# Patient Record
Sex: Female | Born: 1965 | Race: White | Hispanic: No | Marital: Married | Smoking: Current every day smoker
Health system: Southern US, Community
[De-identification: ages and names within clinical notes are randomized; demographics above are authoritative.]

## PROBLEM LIST (undated history)

## (undated) DIAGNOSIS — O00109 Unspecified tubal pregnancy without intrauterine pregnancy: Secondary | ICD-10-CM

## (undated) DIAGNOSIS — C801 Malignant (primary) neoplasm, unspecified: Secondary | ICD-10-CM

## (undated) HISTORY — PX: OOPHORECTOMY: SHX86

---

## 2005-02-10 ENCOUNTER — Emergency Department: Payer: Self-pay | Admitting: Emergency Medicine

## 2007-03-21 ENCOUNTER — Emergency Department: Payer: Self-pay | Admitting: Emergency Medicine

## 2008-05-19 ENCOUNTER — Emergency Department: Payer: Self-pay | Admitting: Internal Medicine

## 2009-03-28 ENCOUNTER — Emergency Department: Payer: Self-pay | Admitting: Unknown Physician Specialty

## 2010-04-30 ENCOUNTER — Emergency Department: Payer: Self-pay | Admitting: Emergency Medicine

## 2010-06-25 ENCOUNTER — Emergency Department: Payer: Self-pay | Admitting: Emergency Medicine

## 2010-09-09 ENCOUNTER — Emergency Department: Payer: Self-pay | Admitting: Emergency Medicine

## 2012-03-05 ENCOUNTER — Emergency Department: Payer: Self-pay | Admitting: Internal Medicine

## 2012-05-16 ENCOUNTER — Emergency Department: Payer: Self-pay | Admitting: Internal Medicine

## 2014-12-14 ENCOUNTER — Emergency Department: Admit: 2014-12-14 | Discharge status: Against Medical Advice | Payer: Self-pay | Admitting: Emergency Medicine

## 2014-12-15 ENCOUNTER — Emergency Department: Admit: 2014-12-15 | Disposition: A | Discharge status: Home / Self Care | Payer: Self-pay | Admitting: Internal Medicine

## 2016-06-17 ENCOUNTER — Encounter: Payer: Self-pay | Admitting: Intensive Care

## 2016-06-17 ENCOUNTER — Emergency Department: Payer: Self-pay

## 2016-06-17 ENCOUNTER — Emergency Department
Admission: EM | Admit: 2016-06-17 | Discharge: 2016-06-17 | Disposition: A | Discharge status: Home / Self Care | Payer: Self-pay | Attending: Emergency Medicine | Admitting: Emergency Medicine

## 2016-06-17 DIAGNOSIS — Y929 Unspecified place or not applicable: Secondary | ICD-10-CM | POA: Insufficient documentation

## 2016-06-17 DIAGNOSIS — F17210 Nicotine dependence, cigarettes, uncomplicated: Secondary | ICD-10-CM | POA: Insufficient documentation

## 2016-06-17 DIAGNOSIS — X500XXA Overexertion from strenuous movement or load, initial encounter: Secondary | ICD-10-CM | POA: Insufficient documentation

## 2016-06-17 DIAGNOSIS — M7918 Myalgia, other site: Secondary | ICD-10-CM

## 2016-06-17 DIAGNOSIS — R0781 Pleurodynia: Secondary | ICD-10-CM | POA: Insufficient documentation

## 2016-06-17 DIAGNOSIS — Y990 Civilian activity done for income or pay: Secondary | ICD-10-CM | POA: Insufficient documentation

## 2016-06-17 DIAGNOSIS — Z8543 Personal history of malignant neoplasm of ovary: Secondary | ICD-10-CM | POA: Insufficient documentation

## 2016-06-17 DIAGNOSIS — Y9389 Activity, other specified: Secondary | ICD-10-CM | POA: Insufficient documentation

## 2016-06-17 HISTORY — DX: Malignant (primary) neoplasm, unspecified: C80.1

## 2016-06-17 HISTORY — DX: Unspecified tubal pregnancy without intrauterine pregnancy: O00.109

## 2016-06-17 MED ORDER — NAPROXEN 500 MG PO TABS
500.0000 mg | ORAL_TABLET | Freq: Once | ORAL | Status: AC
  Administered 2016-06-17: 500 mg via ORAL
  Filled 2016-06-17: qty 1

## 2016-06-17 MED ORDER — CYCLOBENZAPRINE HCL 10 MG PO TABS
10.0000 mg | ORAL_TABLET | Freq: Once | ORAL | Status: AC
  Administered 2016-06-17: 10 mg via ORAL
  Filled 2016-06-17: qty 1

## 2016-06-17 MED ORDER — NAPROXEN 500 MG PO TABS: 500.0000 mg | ORAL_TABLET | Freq: Two times a day (BID) | ORAL | 0 refills | Status: DC

## 2016-06-17 MED ORDER — CYCLOBENZAPRINE HCL 10 MG PO TABS: 10.0000 mg | ORAL_TABLET | Freq: Three times a day (TID) | ORAL | 0 refills | Status: DC | PRN

## 2016-06-17 NOTE — Discharge Instructions (Signed)
Follow up with the primary care provider of your choice for symptoms not improving over the week. Return to the ER for symptoms that change or worsen.

## 2016-06-17 NOTE — ED Provider Notes (Signed)
[facility] Emergency Department Provider Note ____________________________________________  Time seen: Approximately 7:34 PM  I have reviewed the triage vital signs and the nursing notes.   HISTORY  Chief Complaint Back Pain    HPI Robin Cline is a 50 y.o. female who presents to the emergency department for evaluation of right upper rib/back pain. She does not recall a specific injury. Pain started Saturday evening and has worsened with each day. She works as a Scientist, water quality and states that the constant twisting motion may be the cause. She has taken tylenol and used [facility] without relief. She does smoke cigarettes and states that the pain increases with deep breath and cough.  Past Medical History:  Diagnosis Date  . 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 Tubal pregnancy     There are no active problems to display for this patient.   Past Surgical History:  Procedure Laterality Date  . OOPHORECTOMY      Prior to Admission medications   Medication Sig Start Date End Date Taking? Authorizing Provider  cyclobenzaprine (FLEXERIL) 10 MG tablet Take 1 tablet (10 mg total) by mouth 3 (three) times daily as needed for muscle spasms. 06/17/16   Victorino Dike, FNP  naproxen (NAPROSYN) 500 MG tablet Take 1 tablet (500 mg total) by mouth 2 (two) times daily with a meal. 06/17/16   Victorino Dike, FNP    Allergies Review of patient's allergies indicates no known allergies.  History reviewed. No pertinent family history.  Social History Social History  Substance Use Topics  . Smoking status: Current Every Day Smoker    Packs/day: 0.50    Types: Cigarettes  . Smokeless tobacco: Never Used  . Alcohol use No    Review of Systems Constitutional: No recent illness. Cardiovascular: Denies chest pain or palpitations. Respiratory: Denies shortness of breath. Musculoskeletal: Pain in right lateral thorax. Skin: Negative for rash, wound, lesion. Neurological:  Negative for focal weakness or numbness.  ____________________________________________   PHYSICAL EXAM:  VITAL SIGNS: ED Triage Vitals  Enc Vitals Group     BP 06/17/16 1836 127/82     Pulse Rate 06/17/16 1836 69     Resp 06/17/16 1836 14     Temp 06/17/16 1836 98.1 F (36.7 C)     Temp Source 06/17/16 1836 Oral     SpO2 06/17/16 1836 99 %     Weight 06/17/16 1836 105 lb (47.6 kg)     Height 06/17/16 1836 5\' 3"  (1.6 m)     Head Circumference --      Peak Flow --      Pain Score 06/17/16 1837 9     Pain Loc --      Pain Edu? --      Excl. in [place]? --     Constitutional: Alert and oriented. Well appearing and in no acute distress. Eyes: Conjunctivae are normal. EOMI. Head: Atraumatic. Neck: No stridor.  Respiratory: Normal respiratory effort. Diminished without definite adventitious sounds. Musculoskeletal: Tenderness to right lateral thorax with radiation around right breast and also in to the right scapular area. Neurologic:  Normal speech and language. No gross focal neurologic deficits are appreciated. Speech is normal. No gait instability. Skin:  Skin is warm, dry and intact. Atraumatic. Psychiatric: Mood and affect are normal. Speech and behavior are normal.  ____________________________________________   LABS (all labs ordered are listed, but only abnormal results are displayed)  Labs Reviewed - No data to display ____________________________________________  RADIOLOGY  No cardiopulmonary abnormality per radiology. I, Sherrie George, personally viewed and evaluated these images (plain radiographs) as part of my medical decision making, as well as reviewing the written report by the radiologist.  ____________________________________________   PROCEDURES  Procedure(s) performed: None   ____________________________________________   INITIAL IMPRESSION / ASSESSMENT AND PLAN / ED COURSE  Clinical Course    Pertinent labs & imaging results that were  available during my care of the patient were reviewed by me and considered in my medical decision making (see chart for details).  Symptoms likely related to frequent and repetitive twisting motion while working causing muscle strain.  Patient was given prescriptions for Flexeril and Naprosyn. She is to follow-up with the primary care provider for choice or return to the emergency department for symptoms that change or worsen. She was also given a work excuse for the next 2 days. ____________________________________________   FINAL CLINICAL IMPRESSION(S) / ED DIAGNOSES  Final diagnoses:  Musculoskeletal pain       Victorino Dike, FNP 06/17/16 2135    Carrie Mew, MD 06/17/16 2342

## 2016-06-17 NOTE — ED Notes (Signed)
Pt reports that she may have "pulled a muscle or cracked a rib" - pain radiates across chest and under right arm - pain increases when she coughs or takes a deep breath - pain started Sunday - pt does not recall any injury but does do some lifting a work that could have injured it (pain is worse at work)

## 2016-06-17 NOTE — ED Triage Notes (Addendum)
Patient presents to ER with R upper side pain that continues to back. She reports she works at [employer] and lifts heavy items and could have pulled a muscle. Pt ambulatory in triage and to waiting area with NAD noted. Pt has tried advil liquid gels with some relief and icy hot.

## 2019-02-23 ENCOUNTER — Encounter: Payer: Self-pay | Admitting: Emergency Medicine

## 2019-02-23 ENCOUNTER — Other Ambulatory Visit: Payer: Self-pay

## 2019-02-23 ENCOUNTER — Emergency Department
Admission: EM | Admit: 2019-02-23 | Discharge: 2019-02-23 | Disposition: A | Discharge status: Home / Self Care | Payer: Self-pay | Attending: Emergency Medicine | Admitting: Emergency Medicine

## 2019-02-23 ENCOUNTER — Emergency Department: Payer: Self-pay

## 2019-02-23 DIAGNOSIS — R42 Dizziness and giddiness: Secondary | ICD-10-CM | POA: Insufficient documentation

## 2019-02-23 DIAGNOSIS — F17210 Nicotine dependence, cigarettes, uncomplicated: Secondary | ICD-10-CM | POA: Insufficient documentation

## 2019-02-23 DIAGNOSIS — Z8543 Personal history of malignant neoplasm of ovary: Secondary | ICD-10-CM | POA: Insufficient documentation

## 2019-02-23 LAB — COMPREHENSIVE METABOLIC PANEL
ALT: 14 U/L (ref 0–44)
AST: 18 U/L (ref 15–41)
Albumin: 4.3 g/dL (ref 3.5–5.0)
Alkaline Phosphatase: 56 U/L (ref 38–126)
Anion gap: 8 (ref 5–15)
BUN: 11 mg/dL (ref 6–20)
CO2: 27 mmol/L (ref 22–32)
Calcium: 8.9 mg/dL (ref 8.9–10.3)
Chloride: 105 mmol/L (ref 98–111)
Creatinine, Ser: 0.68 mg/dL (ref 0.44–1.00)
GFR calc Af Amer: >60 mL/min (ref >60)
GFR calc non Af Amer: >60 mL/min (ref >60)
Glucose, Bld: 91 mg/dL (ref 70–99)
Potassium: 3.6 mmol/L (ref 3.5–5.1)
Sodium: 140 mmol/L (ref 135–145)
Total Bilirubin: 0.5 mg/dL (ref 0.3–1.2)
Total Protein: 7 g/dL (ref 6.5–8.1)

## 2019-02-23 LAB — CBC
HCT: 41.9 % (ref 36.0–46.0)
Hemoglobin: 13.9 g/dL (ref 12.0–15.0)
MCH: 32.3 pg (ref 26.0–34.0)
MCHC: 33.2 g/dL (ref 30.0–36.0)
MCV: 97.4 fL (ref 80.0–100.0)
Platelets: 226 10*3/uL (ref 150–400)
RBC: 4.3 MIL/uL (ref 3.87–5.11)
RDW: 13.2 % (ref 11.5–15.5)
WBC: 6.5 10*3/uL (ref 4.0–10.5)
nRBC: 0 % (ref 0.0–0.2)

## 2019-02-23 LAB — URINALYSIS, COMPLETE (UACMP) WITH MICROSCOPIC
Bacteria, UA: NONE SEEN
Bilirubin Urine: NEGATIVE
Glucose, UA: NEGATIVE mg/dL
Hgb urine dipstick: NEGATIVE
Ketones, ur: NEGATIVE mg/dL
Leukocytes,Ua: NEGATIVE
Nitrite: NEGATIVE
Protein, ur: NEGATIVE mg/dL
Specific Gravity, Urine: 1.004 — ABNORMAL LOW (ref 1.005–1.030)
pH: 6 (ref 5.0–8.0)

## 2019-02-23 LAB — GLUCOSE, CAPILLARY: Glucose-Capillary: 96 mg/dL (ref 70–99)

## 2019-02-23 MED ORDER — MECLIZINE HCL 25 MG PO TABS: 25.0000 mg | ORAL_TABLET | Freq: Three times a day (TID) | ORAL | 0 refills | Status: DC | PRN

## 2019-02-23 MED ORDER — MECLIZINE HCL 25 MG PO TABS
25.0000 mg | ORAL_TABLET | Freq: Once | ORAL | Status: AC
  Administered 2019-02-23: 25 mg via ORAL
  Filled 2019-02-23: qty 1

## 2019-02-23 MED ORDER — SODIUM CHLORIDE 0.9 % IV BOLUS
1000.0000 mL | Freq: Once | INTRAVENOUS | Status: AC
  Administered 2019-02-23: 1000 mL via INTRAVENOUS

## 2019-02-23 NOTE — Discharge Instructions (Signed)
Follow-up with critical clinic neurology.  Please call for an appointment.  Take the meclizine as prescribed.  Drink plenty of water.  Return emergency department worsening.

## 2019-02-23 NOTE — ED Provider Notes (Signed)
[facility] Emergency Department Provider Note  ____________________________________________   First MD Initiated Contact with Patient 02/23/19 1138     (approximate)  I have reviewed the triage vital signs and the nursing notes.   HISTORY  Chief Complaint Dizziness    HPI Robin Cline is a 53 y.o. female presents emergency department stating she has had episodes of dizziness for the last 2 weeks.  She states it is intermittent.  Is not attributed to change in position.  She states she can just be standing it suddenly happened.  She thinks she has been drinking enough fluids.  She denies any chest pain or shortness of breath.  She denies any vomiting or diarrhea.    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   Past Surgical History:  Procedure Laterality Date   OOPHORECTOMY      Prior to Admission medications   Medication Sig Start Date End Date Taking? Authorizing Provider  meclizine (ANTIVERT) 25 MG tablet Take 1 tablet (25 mg total) by mouth 3 (three) times daily as needed for dizziness. 02/23/19   Versie Starks, PA-C    Allergies Patient has no known allergies.  No family history on file.  Social History Social History   Tobacco Use   Smoking status: Current Every Day Smoker    Packs/day: 0.50    Types: Cigarettes   Smokeless tobacco: Never Used  Substance Use Topics   Alcohol use: No   Drug use: Yes    Types: Marijuana    Review of Systems  Constitutional: No fever/chills, positive dizziness Eyes: No visual changes. ENT: No sore throat. Respiratory: Denies cough Genitourinary: Negative for dysuria. Musculoskeletal: Negative for back pain. Skin: Negative for rash.    ____________________________________________   PHYSICAL EXAM:  VITAL SIGNS: ED Triage Vitals [02/23/19 1000]  Enc Vitals Group      BP 139/71     Pulse Rate 72     Resp 20     Temp 97.9 F (36.6 C)     Temp Source Oral     SpO2 100 %     Weight 105 lb (47.6 kg)     Height '5\' 3"'  (1.6 m)     Head Circumference      Peak Flow      Pain Score 0     Pain Loc      Pain Edu?      Excl. in [place]?     Constitutional: Alert and oriented. Well appearing and in no acute distress. Eyes: Conjunctivae are normal.  Positive nystagmus bilaterally Head: Atraumatic. Nose: No congestion/rhinnorhea. Mouth/Throat: Mucous membranes are moist.   Neck:  supple no lymphadenopathy noted Cardiovascular: Normal rate, regular rhythm. Heart sounds are normal Respiratory: Normal respiratory effort.  No retractions, lungs c t a  GU: deferred Musculoskeletal: FROM all extremities, warm and well perfused Neurologic:  Normal speech and language.  Cranial nerves II through XII grossly intact Skin:  Skin is warm, dry and intact. No rash noted. Psychiatric: Mood and affect are normal. Speech and behavior are normal.  ____________________________________________   LABS (all labs ordered are listed, but only abnormal results are displayed)  Labs Reviewed  URINALYSIS, COMPLETE (UACMP) WITH MICROSCOPIC - Abnormal; Notable for the following components:      Result Value   Color, Urine STRAW (*)  APPearance CLEAR (*)    Specific Gravity, Urine 1.004 (*)    All other components within normal limits  CBC  COMPREHENSIVE METABOLIC PANEL  GLUCOSE, CAPILLARY  CBG MONITORING, ED   ____________________________________________   ____________________________________________  RADIOLOGY  Ct head is negative  ____________________________________________   PROCEDURES  Procedure(s) performed: saline lock, ns 1L iv, antivert 74m po  Procedures    ____________________________________________   INITIAL IMPRESSION / ASSESSMENT AND PLAN / ED COURSE  Pertinent labs & imaging results that were available during my care of the patient  were reviewed by me and considered in my medical decision making (see chart for details).   Patient is 53year old female presents emergency department with complaints of dizziness.  Intermittent dyspnea for the last couple of weeks.  She states she drinks lots of sodas.  She does not drink much water.  She states the dizziness will last couple minutes and then go away.  No chest pain or shortness of breath associated with dizziness.  Physical exam patient appears well.  Vitals are normal.  Labs are normal and CT of the head is normal.  EKG is normal  CBC normal, metabolic panel is normal, urinalysis is normal, troponin is normal  CT the head is normal, EKG is normal  Explained all the findings to the patient.  Explained her she needs to drink plenty of water.  Take the Antivert as prescribed.  Follow-up with neurology if not improving.  She states she understands will comply.  Is discharged stable condition.  Return to the emergency department worsening     As part of my medical decision making, I reviewed the following data within the e[place]notes reviewed and incorporated, Labs reviewed CBC, met C, troponin and UA are normal, EKG interpreted NSR, Old chart reviewed, Radiograph reviewed CT of the head is normal, Notes from prior ED visits and [place] Controlled Substance Database  ____________________________________________   FINAL CLINICAL IMPRESSION(S) / ED DIAGNOSES  Final diagnoses:  Dizziness      NEW MEDICATIONS STARTED DURING THIS VISIT:  New Prescriptions   MECLIZINE (ANTIVERT) 25 MG TABLET    Take 1 tablet (25 mg total) by mouth 3 (three) times daily as needed for dizziness.     Note:  This document was prepared using Dragon voice recognition software and may include unintentional dictation errors.    FVersie Starks PA-C 02/23/19 1355    SCarrie Mew MD 02/23/19 1436

## 2019-02-23 NOTE — ED Triage Notes (Signed)
Pt reports for the last couple of weeks she has had intermittent episodes of dizziness that will last a couple of minutes and then go away. Pt denies pain or any other sx's.

## 2019-10-18 IMAGING — CT CT HEAD WITHOUT CONTRAST
3 series · 16 of 46 positions shown, 19 images · non-contrast
Comparison: Head CT 03/05/2012.

CLINICAL DATA: Intermittent episodes of dizziness the last 2-3
minutes and then resolve over the past several weeks.

EXAM:
CT HEAD WITHOUT CONTRAST
TECHNIQUE: Contiguous axial images were obtained from the base of the skull
through the vertex without intravenous contrast.

[Series 2: head wo · axial · 0.39mm/px · z∈[-91,+29]mm · 10 of 29 slices shown, 13 images]
[im 3/29  brain]
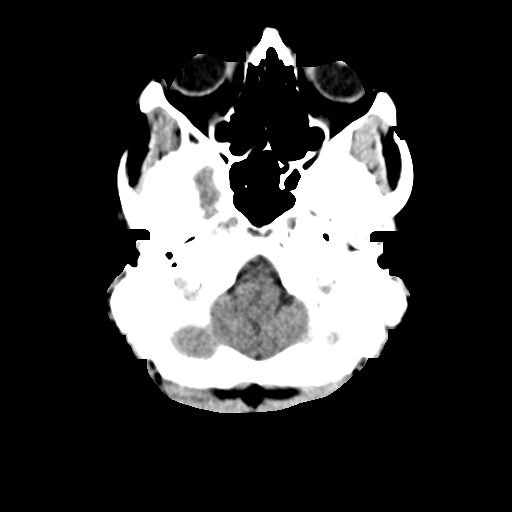
[im 3/29  bone]
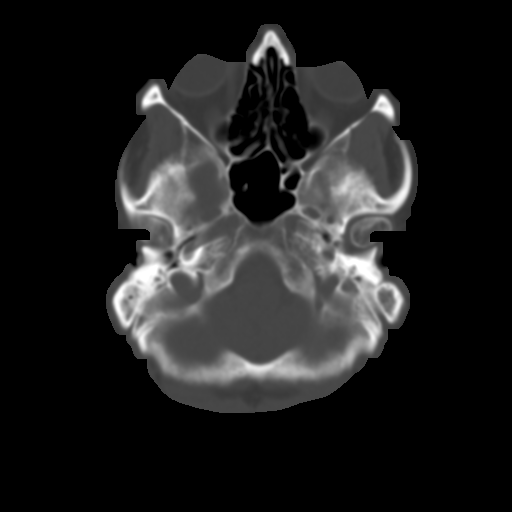
[im 6/29  brain]
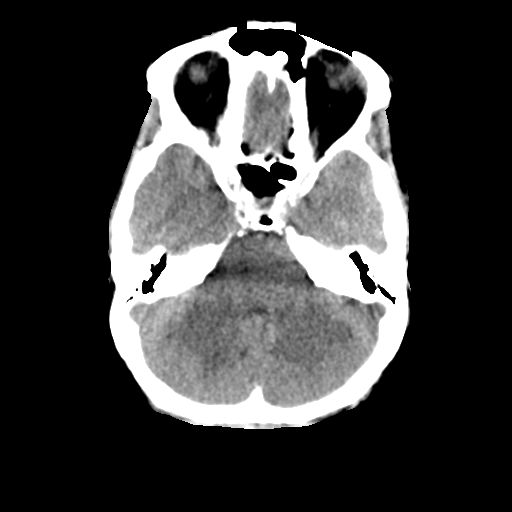
[im 8/29  brain]
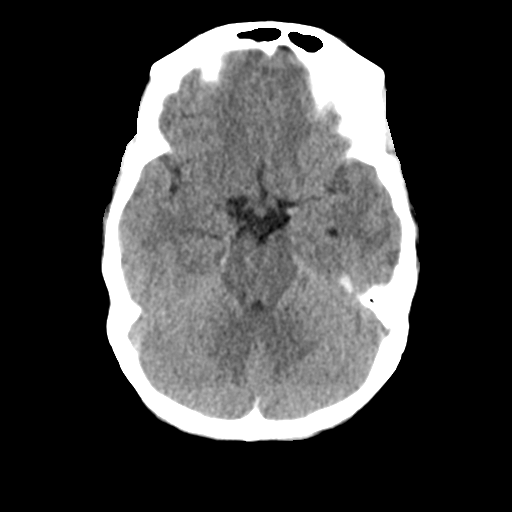
[im 11/29  brain]
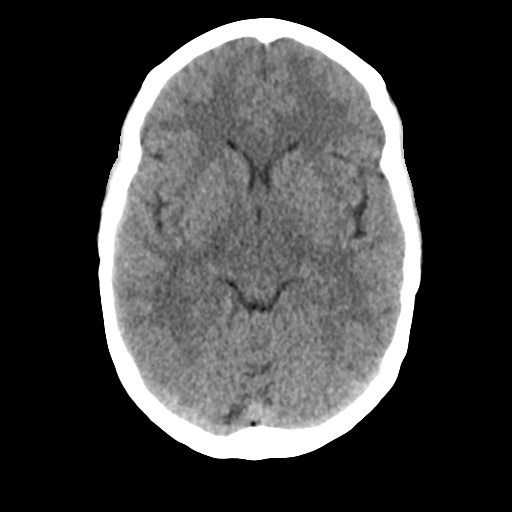
[im 14/29  brain]
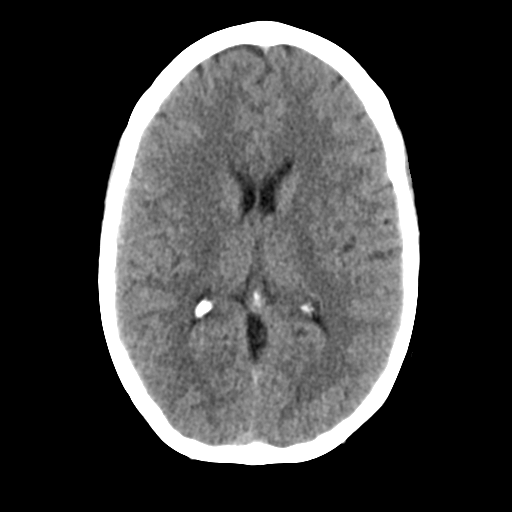
[im 14/29  bone]
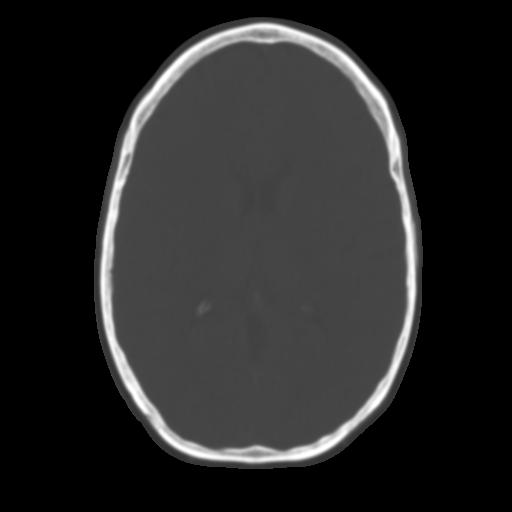
[im 16/29  brain]
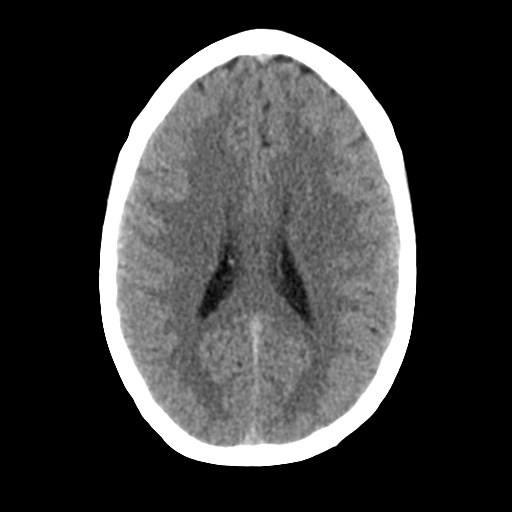
[im 19/29  brain]
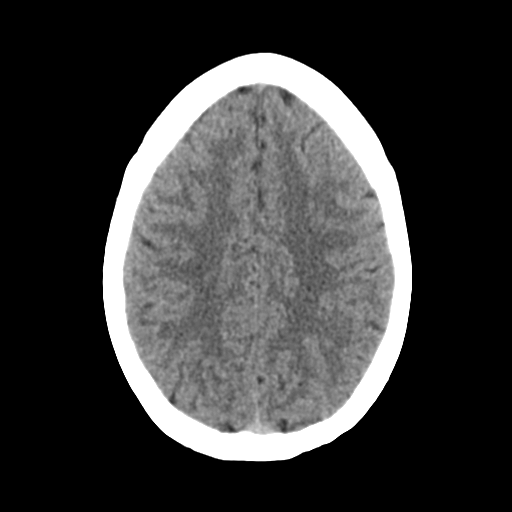
[im 22/29  brain]
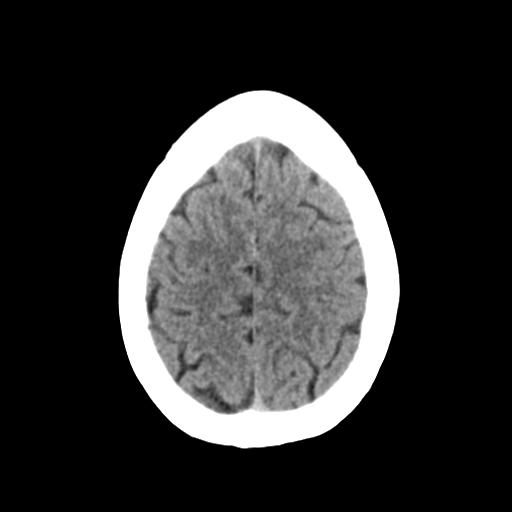
[im 24/29  brain]
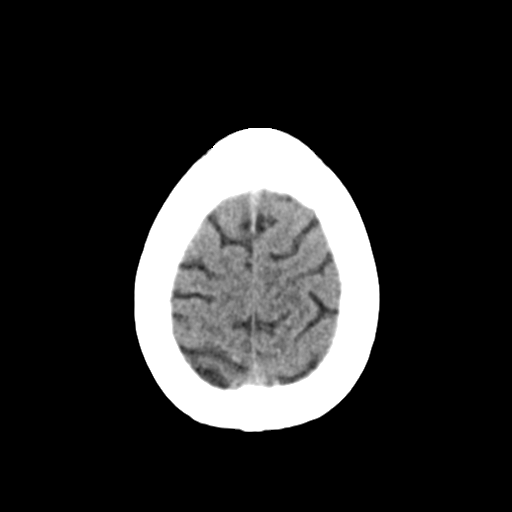
[im 24/29  bone]
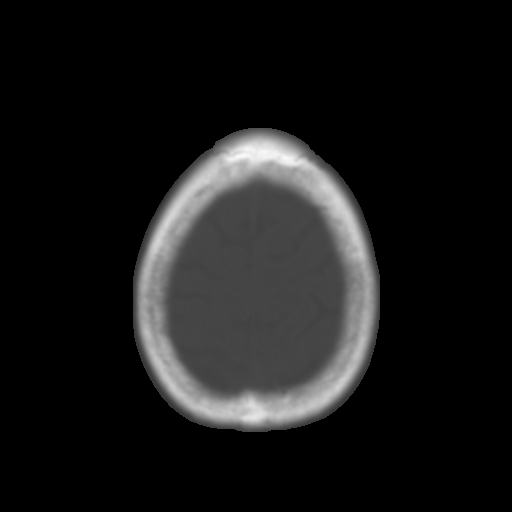
[im 27/29  brain]
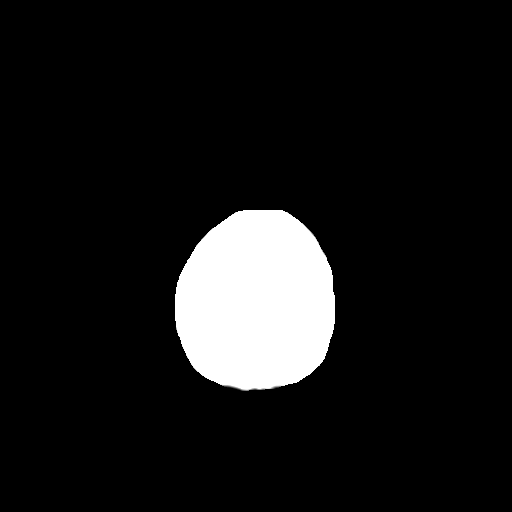

[Series 4: coronal soft tissue · coronal · 0.27mm/px · 3 of 60 slices shown]
[im 20/60  brain]
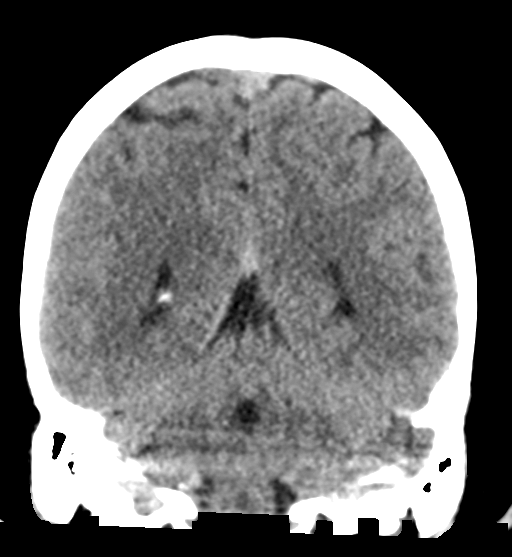
[im 27/60  brain]
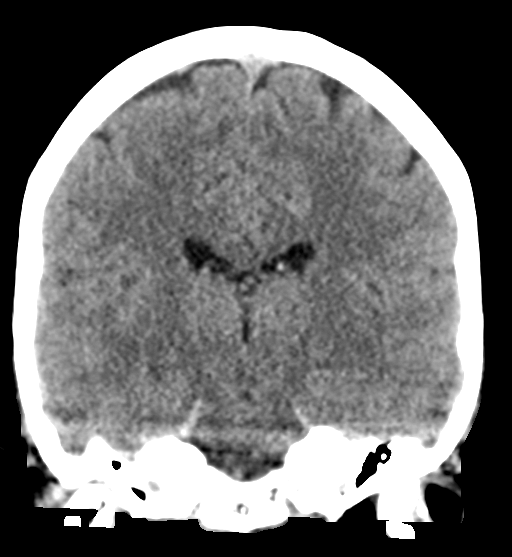
[im 33/60  brain]
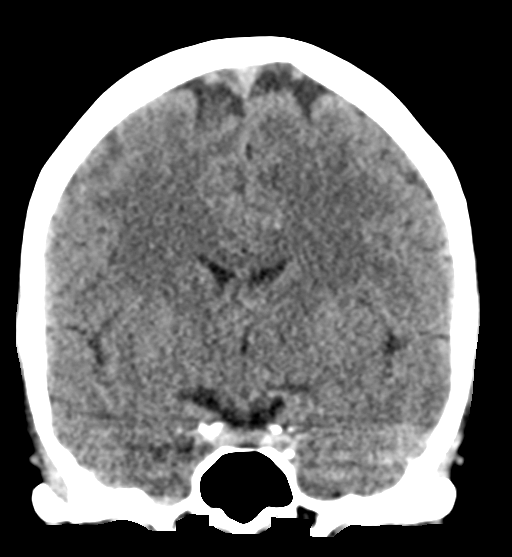

[Series 5: sagittal soft tissue · sagittal · 0.29mm/px · 3 of 52 slices shown]
[im 18/52  brain]
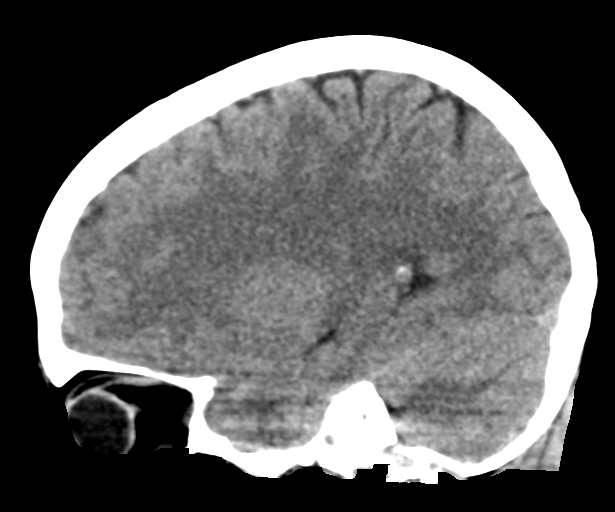
[im 26/52  brain]
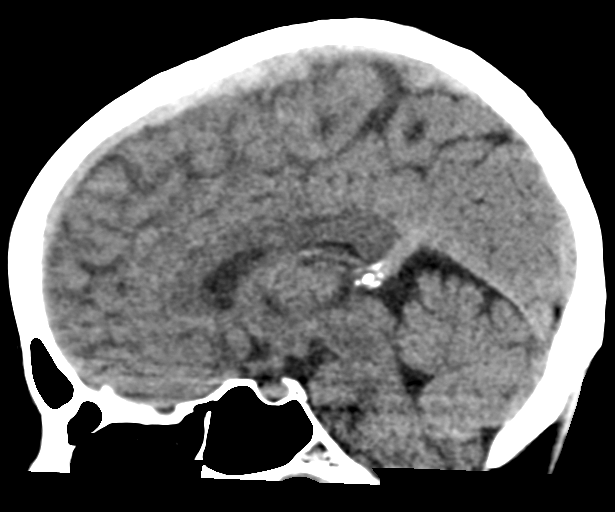
[im 35/52  brain]
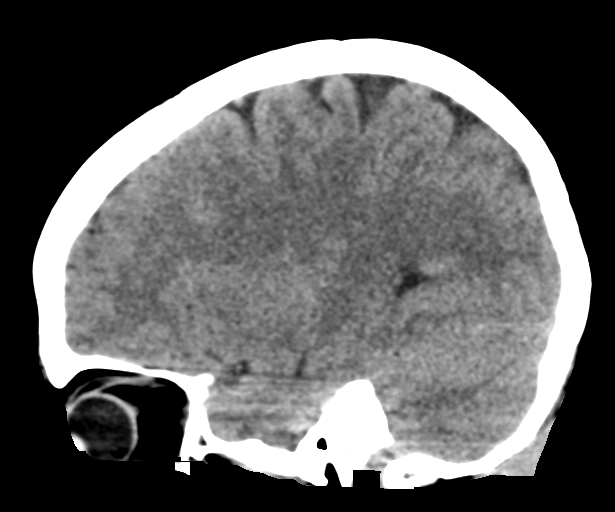

[16 of 46 positions shown; findings below may reference images not displayed]

FINDINGS: Brain: No evidence of acute infarction, hemorrhage, hydrocephalus,
extra-axial collection or mass lesion/mass effect.

Vascular: No hyperdense vessel or unexpected calcification.

Skull: Normal.  No fracture or focal lesion.

Sinuses/Orbits: Normal.

Other: None.
IMPRESSION: Normal head CT.

## 2021-04-03 ENCOUNTER — Encounter: Payer: Self-pay | Admitting: Emergency Medicine

## 2021-04-03 ENCOUNTER — Emergency Department
Admission: EM | Admit: 2021-04-03 | Discharge: 2021-04-03 | Disposition: A | Discharge status: Home / Self Care | Payer: Self-pay | Attending: Student in an Organized Health Care Education/Training Program | Admitting: Student in an Organized Health Care Education/Training Program

## 2021-04-03 ENCOUNTER — Other Ambulatory Visit: Payer: Self-pay

## 2021-04-03 ENCOUNTER — Emergency Department: Payer: Self-pay

## 2021-04-03 DIAGNOSIS — M65132 Other infective (teno)synovitis, left wrist: Secondary | ICD-10-CM | POA: Insufficient documentation

## 2021-04-03 DIAGNOSIS — F17210 Nicotine dependence, cigarettes, uncomplicated: Secondary | ICD-10-CM | POA: Insufficient documentation

## 2021-04-03 DIAGNOSIS — M659 Synovitis and tenosynovitis, unspecified: Secondary | ICD-10-CM

## 2021-04-03 DIAGNOSIS — Z8543 Personal history of malignant neoplasm of ovary: Secondary | ICD-10-CM | POA: Insufficient documentation

## 2021-04-03 DIAGNOSIS — M65312 Trigger thumb, left thumb: Secondary | ICD-10-CM | POA: Insufficient documentation

## 2021-04-03 MED ORDER — NAPROXEN 500 MG PO TABS: 500.0000 mg | ORAL_TABLET | Freq: Two times a day (BID) | ORAL | 0 refills | Status: DC

## 2021-04-03 NOTE — ED Triage Notes (Signed)
C/O left thumb pain x 1 month.  Pain radiating into left wrist.

## 2021-04-03 NOTE — ED Provider Notes (Signed)
[facility] Emergency Department Provider Note   ____________________________________________   Event Date/Time   First MD Initiated Contact with Patient 04/03/21 [phone]     (approximate)  I have reviewed the triage vital signs and the nursing notes.   HISTORY  Chief Complaint Hand Pain   HPI Robin Cline is a 55 y.o. female began having problems with her hand and left thumb 1 month ago without injury.  She has been taking Excedrin infrequently without any relief.  Patient states that also when she bends her thumb that it seems to lock and then there is a popping sensation when she tries to straighten her thumb.         Past Medical History:  Diagnosis Date   Cancer [facility])    Ovaries removed   Tubal pregnancy     There are no problems to display for this patient.   Past Surgical History:  Procedure Laterality Date   OOPHORECTOMY      Prior to Admission medications   Medication Sig Start Date End Date Taking? Authorizing Provider  naproxen (NAPROSYN) 500 MG tablet Take 1 tablet (500 mg total) by mouth 2 (two) times daily with a meal. 04/03/21  Yes Johnn Hai, PA-C  meclizine (ANTIVERT) 25 MG tablet Take 1 tablet (25 mg total) by mouth 3 (three) times daily as needed for dizziness. 02/23/19   Versie Starks, PA-C    Allergies Patient has no known allergies.  No family history on file.  Social History Social History   Tobacco Use   Smoking status: Every Day    Packs/day: 0.50    Types: Cigarettes   Smokeless tobacco: Never  Substance Use Topics   Alcohol use: No   Drug use: Yes    Types: Marijuana    Review of Systems Constitutional: No fever/chills Eyes: No visual changes. Cardiovascular: Denies chest pain. Respiratory: Denies shortness of breath. Gastrointestinal: No abdominal pain.  No nausea, no vomiting.  Musculoskeletal: Positive left hand pain. Skin: Negative for rash. Neurological: Negative for headaches,  focal weakness or numbness.   ____________________________________________   PHYSICAL EXAM:  VITAL SIGNS: ED Triage Vitals  Enc Vitals Group     BP 04/03/21 0822 (!) 142/98     Pulse Rate 04/03/21 0822 68     Resp 04/03/21 0822 16     Temp 04/03/21 0822 97.8 F (36.6 C)     Temp Source 04/03/21 0822 Oral     SpO2 04/03/21 0822 100 %     Weight 04/03/21 0820 104 lb 15 oz (47.6 kg)     Height 04/03/21 0820 5\' 3"  (1.6 m)     Head Circumference --      Peak Flow --      Pain Score 04/03/21 0819 9     Pain Loc --      Pain Edu? --      Excl. in [place]? --     Constitutional: Alert and oriented. Well appearing and in no acute distress. Eyes: Conjunctivae are normal. PERRL. EOMI. Head: Atraumatic. Nose: No congestion/rhinnorhea. Mouth/Throat: Mucous membranes are moist.  Oropharynx non-erythematous. Neck: No stridor.   Cardiovascular: Normal rate, regular rhythm. Grossly normal heart sounds.  Good peripheral circulation. Respiratory: Normal respiratory effort.  No retractions. Lungs CTAB. Gastrointestinal: Soft and nontender. No distention. No abdominal bruits. No CVA tenderness. Musculoskeletal: On examination of left hand there is no gross deformity specifically of the left thumb.  Range of motion with the left thumb  is decreased with flexion.  Patient has some difficulty extending her thumb and there is tenderness on the DIP and MP joint.  Also noted was a slight "pop" with patient extending her thumb.  Tenderness on palpation of the tendon extends past the wrist area.  No erythema or edema is present.  Skin is intact.  Capillary refills less than 3 seconds. Neurologic:  Normal speech and language. No gross focal neurologic deficits are appreciated.  Skin:  Skin is warm, dry and intact. No rash noted. Psychiatric: Mood and affect are normal. Speech and behavior are normal.  ____________________________________________   LABS (all labs ordered are listed, but only abnormal results  are displayed)  Labs Reviewed - No data to display ____________________________________________ ____________________________________________  RADIOLOGY Leana Gamer, personally viewed and evaluated these images (plain radiographs) as part of my medical decision making, as well as reviewing the written report by the radiologist.   Official radiology report(s): DG Hand Complete Left  Result Date: 04/03/2021 CLINICAL DATA:  Left hand pain for 1 month, no known injury, initial encounter EXAM: LEFT HAND - COMPLETE 3+ VIEW COMPARISON:  None. FINDINGS: There is no evidence of fracture or dislocation. There is no evidence of arthropathy or other focal bone abnormality. Soft tissues are unremarkable. IMPRESSION: No acute abnormality noted. Electronically Signed   By: Inez Catalina M.D.   On: 04/03/2021 09:36    ____________________________________________   PROCEDURES  Procedure(s) performed (including Critical Care):  Procedures   ____________________________________________   INITIAL IMPRESSION / ASSESSMENT AND PLAN / ED COURSE  As part of my medical decision making, I reviewed the following data within the electronic MEDICAL RECORD NUMBER Notes from prior ED visits and [place] Controlled Substance Database  55 year old female presents to the ED with complaint of left wrist pain and left thumb pain for approximate 1 month.  She states that there was no injury prior to her pain and she has noticed that her left thumb does not flex without popping.  Patient has been taking Excedrin without any relief of her pain.  X-rays were negative for any acute bony injury.  Patient was made aware that most likely she has a tenosynovitis as it extends past her wrist and a trigger thumb.  We will place in a thumb spica splint for protection and support.  This is her nondominant hand.  Will start on naproxen 500 mg twice daily with food and she is to follow-up with Dr. Harlow Mares who is on-call for orthopedics if  any continued problems or not improving.  ____________________________________________   FINAL CLINICAL IMPRESSION(S) / ED DIAGNOSES  Final diagnoses:  Trigger thumb of left hand  Tenosynovitis of left wrist     ED Discharge Orders          Ordered    naproxen (NAPROSYN) 500 MG tablet  2 times daily with meals        04/03/21 1017             Note:  This document was prepared using Dragon voice recognition software and may include unintentional dictation errors.    Johnn Hai, PA-C 04/03/21 1045    Merlyn Lot, MD 04/03/21 1350

## 2021-04-03 NOTE — Discharge Instructions (Addendum)
Follow up with Dr. Harlow Mares.  You will need to call and make an appointment.  His office is located in [place] and the address and phone numbers listed on your discharge papers.  Wear the splint for protection and support.  A prescription for naproxen was sent to the pharmacy.  Also a good Rx card is attached to your discharge papers to make this prescription less expensive.

## 2021-11-26 IMAGING — CR DG HAND COMPLETE 3+V*L*
3 series · 3 of 3 positions shown · non-contrast
Comparison: None.

CLINICAL DATA: Left hand pain for 1 month, no known injury, initial
encounter

EXAM:
LEFT HAND - COMPLETE 3+ VIEW

[hand ap]
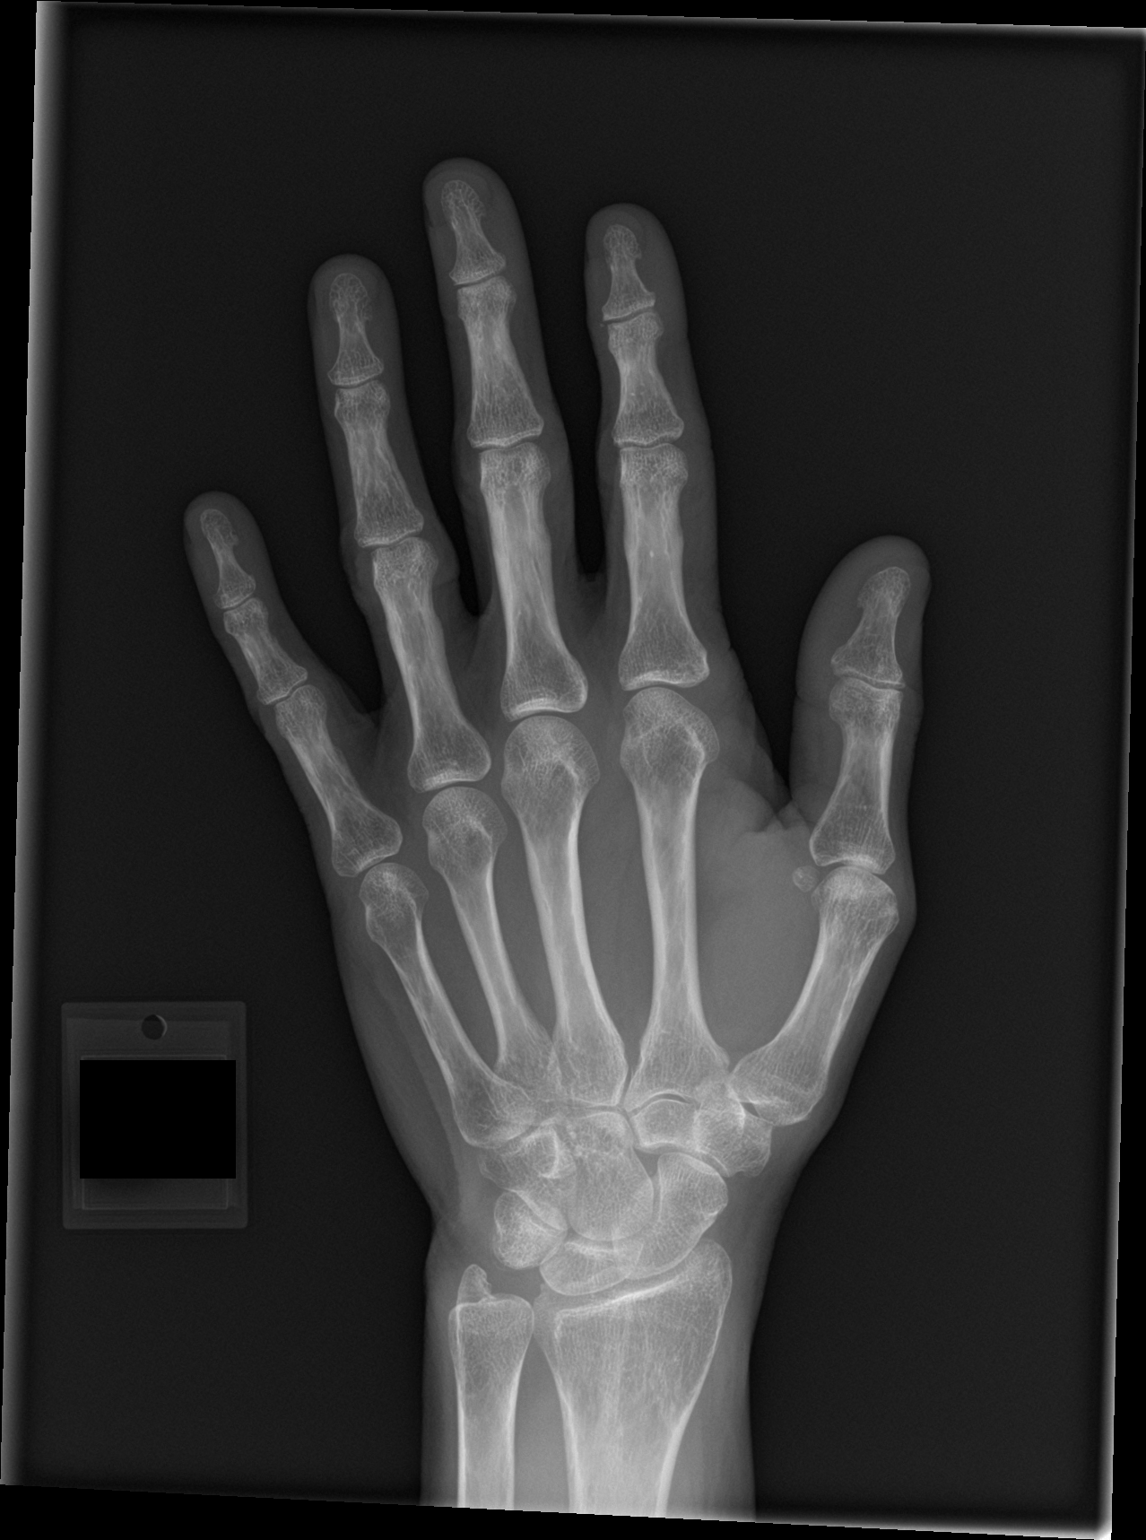

[hand obl]
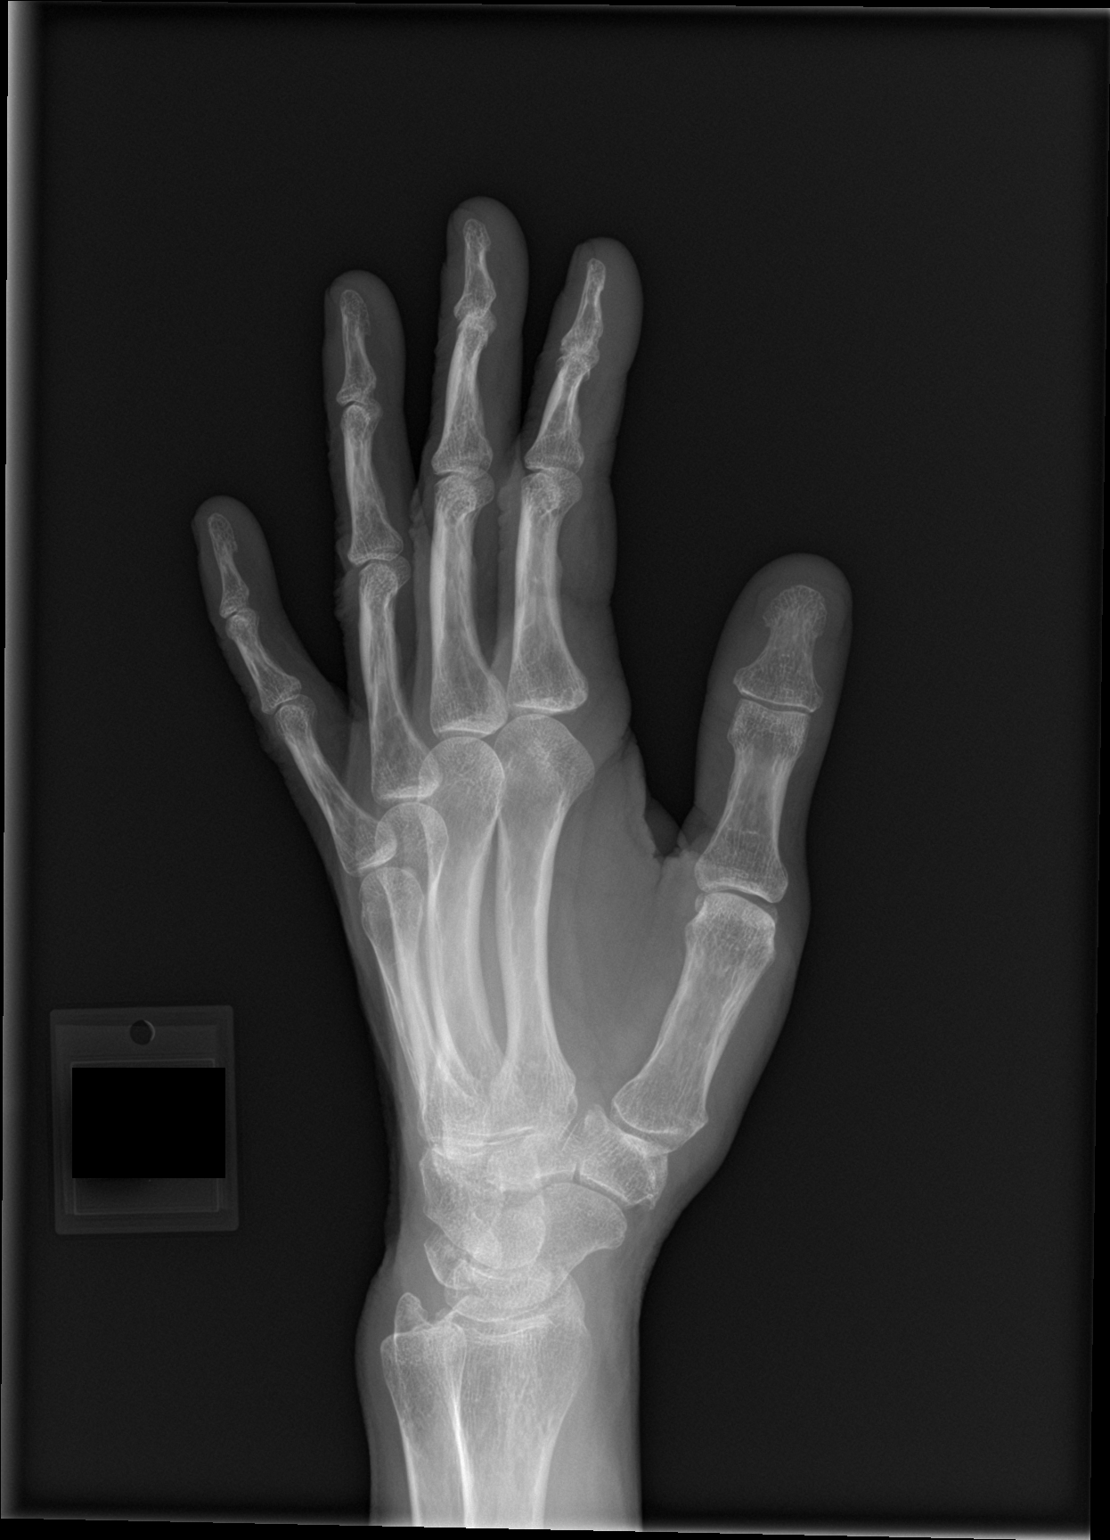

[hand lat]
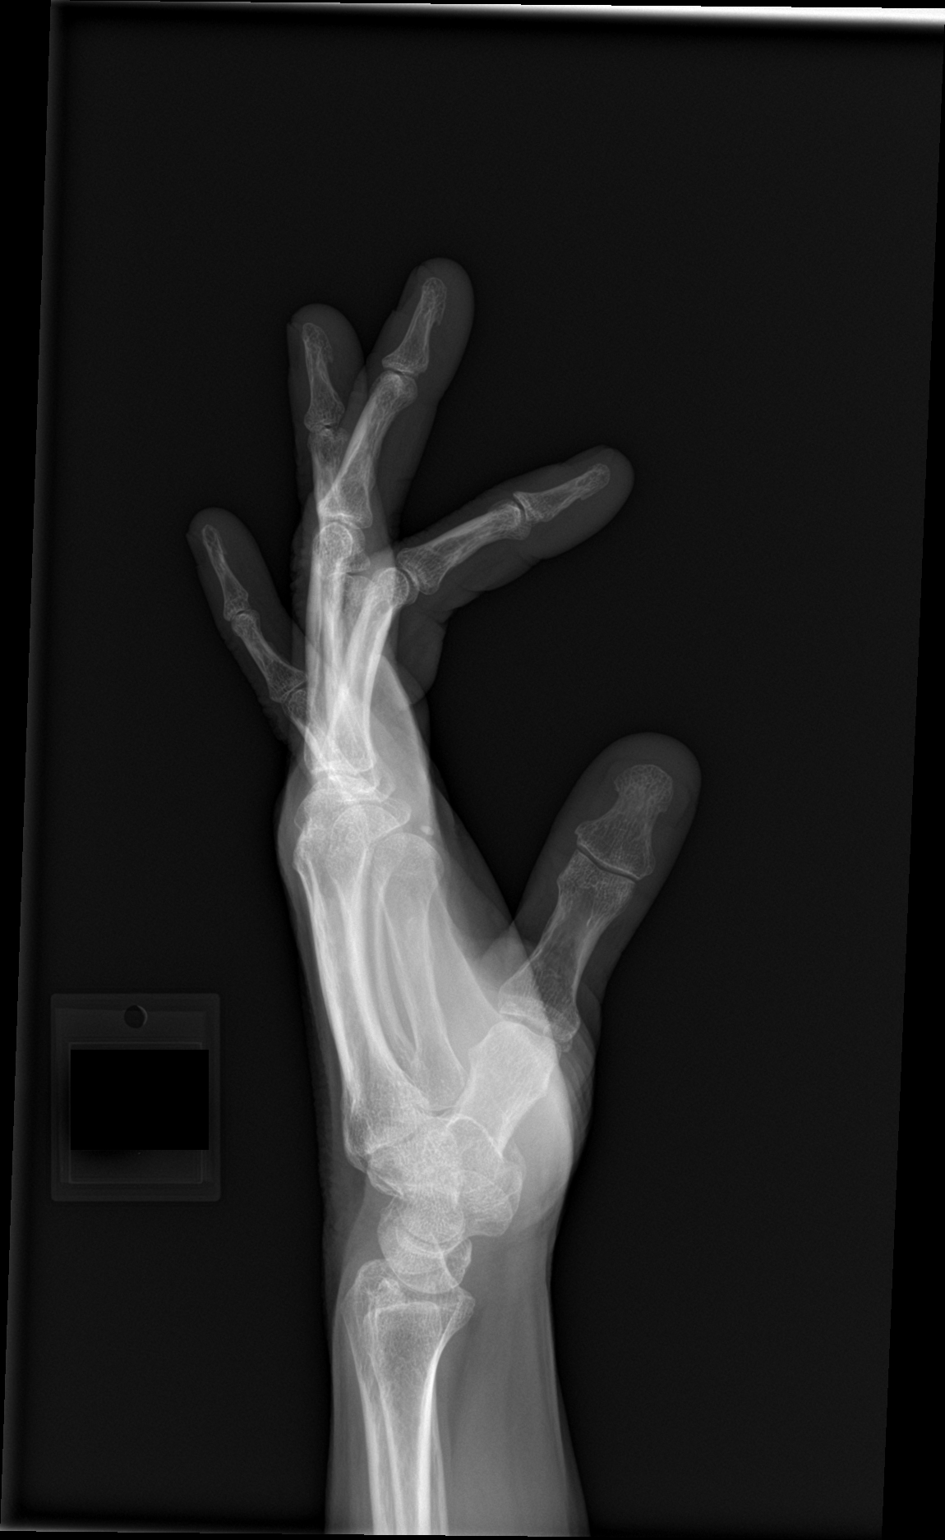

[3 of 3 positions shown; findings below may reference images not displayed]

FINDINGS: There is no evidence of fracture or dislocation. There is no
evidence of arthropathy or other focal bone abnormality. Soft
tissues are unremarkable.
IMPRESSION: No acute abnormality noted.

## 2022-06-18 ENCOUNTER — Encounter: Payer: Self-pay | Admitting: Emergency Medicine

## 2022-06-18 ENCOUNTER — Ambulatory Visit
Admission: EM | Admit: 2022-06-18 | Discharge: 2022-06-18 | Disposition: A | Discharge status: Home / Self Care | Payer: Self-pay | Attending: Physician Assistant | Admitting: Physician Assistant

## 2022-06-18 DIAGNOSIS — M545 Low back pain, unspecified: Secondary | ICD-10-CM | POA: Insufficient documentation

## 2022-06-18 LAB — URINALYSIS, ROUTINE W REFLEX MICROSCOPIC
Bilirubin Urine: NEGATIVE
Glucose, UA: NEGATIVE mg/dL
Nitrite: NEGATIVE
Protein, ur: NEGATIVE mg/dL
Specific Gravity, Urine: 1.025 (ref 1.005–1.030)
pH: 5.5 (ref 5.0–8.0)

## 2022-06-18 LAB — URINALYSIS, MICROSCOPIC (REFLEX)

## 2022-06-18 MED ORDER — NAPROXEN 375 MG PO TABS: 375.0000 mg | ORAL_TABLET | Freq: Two times a day (BID) | ORAL | 0 refills | Status: AC | PRN

## 2022-06-18 MED ORDER — BACLOFEN 10 MG PO TABS: 10.0000 mg | ORAL_TABLET | Freq: Three times a day (TID) | ORAL | 0 refills | Status: DC | PRN

## 2022-06-18 NOTE — Discharge Instructions (Addendum)

## 2022-06-18 NOTE — ED Provider Notes (Signed)
[facility]    CSN: [id number] Arrival date & time: 06/18/22  1444      History   Chief Complaint Chief Complaint  Patient presents with   Back Pain    HPI Robin Cline is a 56 y.o. female presenting for right lower back pain that started last night.  She says that she really woke up with it today.  Pain is in the center of the back and then radiates bilaterally.  It is not associated with any pain radiating to the legs or abdomen.  No associated numbness, weakness or tingling.  Denies dysuria, frequency, urgency, blood in urine.  Increased pain with forward flexion but some improvement in pain with extension of back.  Increased pain with rotating at hips.  Pain improves with back support such as in a chair.  Has tried ibuprofen, Tylenol, IcyHot without improvement in pain.  She denies ever having any injuries.  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  Unsure if her symptoms are related to work or not.  Never had any issues with her back before.  No other complaints.  HPI  Past Medical History:  Diagnosis Date   Cancer [facility])    Ovaries removed   Tubal pregnancy     There are no problems to display for this patient.   Past Surgical History:  Procedure Laterality Date   OOPHORECTOMY      OB History   No obstetric history on file.      Home Medications    Prior to Admission medications   Medication Sig Start Date End Date Taking? Authorizing Provider  baclofen (LIORESAL) 10 MG tablet Take 1 tablet (10 mg total) by mouth 3 (three) times daily as needed for muscle spasms. 06/18/22  Yes Laurene Footman B, PA-C  naproxen (NAPROSYN) 375 MG tablet Take 1 tablet (375 mg total) by mouth 2 (two) times daily as needed for moderate pain. 06/18/22  Yes Danton Clap, PA-C    Family History No family history on file.  Social History Social History   Tobacco Use   Smoking status: Every Day    Packs/day:  0.50    Types: Cigarettes   Smokeless tobacco: Never  Vaping Use   Vaping Use: Never used  Substance Use Topics   Alcohol use: No   Drug use: Not Currently    Types: Marijuana     Allergies   Patient has no known allergies.   Review of Systems Review of Systems  Gastrointestinal:  Negative for abdominal pain, diarrhea and vomiting.  Genitourinary:  Negative for dysuria and flank pain.  Musculoskeletal:  Positive for back pain. Negative for gait problem.  Neurological:  Negative for weakness and numbness.     Physical Exam Triage Vital Signs ED Triage Vitals  Enc Vitals Group     BP 06/18/22 1500 122/80     Pulse Rate 06/18/22 1500 85     Resp 06/18/22 1500 16     Temp 06/18/22 1500 98.2 F (36.8 C)     Temp Source 06/18/22 1500 Oral     SpO2 06/18/22 1500 96 %     Weight --      Height --      Head Circumference --      Peak Flow --      Pain Score 06/18/22 1457 9     Pain Loc --  Pain Edu? --      Excl. in [place]? --    No data found.  Updated Vital Signs BP 122/80 (BP Location: Left Arm)   Pulse 85   Temp 98.2 F (36.8 C) (Oral)   Resp 16   SpO2 96%    Physical Exam Vitals and nursing note reviewed.  Constitutional:      General: She is not in acute distress.    Appearance: Normal appearance. She is not ill-appearing or toxic-appearing.  HENT:     Head: Normocephalic and atraumatic.  Eyes:     General: No scleral icterus.       Right eye: No discharge.        Left eye: No discharge.     Conjunctiva/sclera: Conjunctivae normal.  Cardiovascular:     Rate and Rhythm: Normal rate and regular rhythm.     Heart sounds: Normal heart sounds.  Pulmonary:     Effort: Pulmonary effort is normal. No respiratory distress.     Breath sounds: Normal breath sounds.  Abdominal:     Palpations: Abdomen is soft.     Tenderness: There is no abdominal tenderness. There is no right CVA tenderness or left CVA tenderness.  Musculoskeletal:     Cervical back:  Neck supple.     Comments: Full flexion and extension of back but has some discomfort with full forward flexion.  Tenderness palpation diffusely throughout the lumbar region, mostly over SI joint and bilateral paralumbar muscles.  Negative straight leg raise bilaterally.  5 /5 strength bilateral lower extremities.  Skin:    General: Skin is dry.  Neurological:     General: No focal deficit present.     Mental Status: She is alert. Mental status is at baseline.     Motor: No weakness.     Gait: Gait normal.  Psychiatric:        Mood and Affect: Mood normal.        Behavior: Behavior normal.        Thought Content: Thought content normal.      UC Treatments / Results  Labs (all labs ordered are listed, but only abnormal results are displayed) Labs Reviewed  URINALYSIS, ROUTINE W REFLEX MICROSCOPIC - Abnormal; Notable for the following components:      Result Value   APPearance HAZY (*)    Hgb urine dipstick TRACE (*)    Ketones, ur TRACE (*)    Leukocytes,Ua TRACE (*)    All other components within normal limits  URINALYSIS, MICROSCOPIC (REFLEX) - Abnormal; Notable for the following components:   Bacteria, UA FEW (*)    All other components within normal limits    EKG   Radiology No results found.  Procedures Procedures (including critical care time)  Medications Ordered in UC Medications - No data to display  Initial Impression / Assessment and Plan / UC Course  I have reviewed the triage vital signs and the nursing notes.  Pertinent labs & imaging results that were available during my care of the patient were reviewed by me and considered in my medical decision making (see chart for details).   56 year old female presents for lower back pain not due to injury that started yesterday.  No radiating pain.  No numbness, weakness or tingling.  No urinary symptoms but worried about kidneys.  No history of kidney problems but has history of kidney stones.  Vitals normal  and stable patient overall well-appearing.  On exam she has tenderness palpation throughout the  lumbar region but negative straight leg raise.  Full range of motion of back but increased discomfort with forward flexion.  5 out of 5 strength bilateral lower extremities.  Nursing staff obtained a urine for urinalysis.  UA shows trace hemoglobin and trace leukocytes.  She denies urinary symptoms.  No suspicion for UTI.  Suspect musculoskeletal back pain, strain most likely related to underlying arthritis.  Offered Toradol injection but she declines.  Sent naproxen and baclofen to pharmacy.  Use heat, ice, topical lidocaine, muscle rubs.  Reviewed return precautions.   Final Clinical Impressions(s) / UC Diagnoses   Final diagnoses:  Acute bilateral low back pain without sciatica     Discharge Instructions      BACK PAIN: Stressed avoiding painful activities . RICE (REST, ICE, COMPRESSION, ELEVATION) guidelines reviewed. May alternate ice and heat. Consider use of muscle rubs, Salonpas patches, etc. Use medications as directed including muscle relaxers if prescribed. Take anti-inflammatory medications as prescribed or OTC NSAIDs/Tylenol.  F/u with PCP in 7-10 days for reexamination, and please feel free to call or return to the urgent care at any time for any questions or concerns you may have and we will be happy to help you!   BACK PAIN RED FLAGS: If the back pain acutely worsens or there are any red flag symptoms such as numbness/tingling, leg weakness, saddle anesthesia, or loss of bowel/bladder control, go immediately to the ER. Follow up with [place] as scheduled or sooner if the pain does not begin to resolve or if it worsens before the follow up       ED Prescriptions     Medication Sig Dispense Auth. Provider   naproxen (NAPROSYN) 375 MG tablet Take 1 tablet (375 mg total) by mouth 2 (two) times daily as needed for moderate pain. 20 tablet Laurene Footman B, PA-C   baclofen (LIORESAL) 10 MG  tablet Take 1 tablet (10 mg total) by mouth 3 (three) times daily as needed for muscle spasms. 20 each Gretta Cool      PDMP not reviewed this encounter.   Danton Clap, PA-C 06/18/22 1545

## 2022-06-18 NOTE — ED Triage Notes (Signed)
Pt presents with lower back pain that started last night. Pt denies any injury or urinary symptoms.

## 2022-10-22 ENCOUNTER — Ambulatory Visit
Admission: EM | Admit: 2022-10-22 | Discharge: 2022-10-22 | Disposition: A | Discharge status: Home / Self Care | Payer: Self-pay | Attending: Emergency Medicine | Admitting: Emergency Medicine

## 2022-10-22 ENCOUNTER — Encounter: Payer: Self-pay | Admitting: Emergency Medicine

## 2022-10-22 DIAGNOSIS — R42 Dizziness and giddiness: Secondary | ICD-10-CM | POA: Insufficient documentation

## 2022-10-22 DIAGNOSIS — R519 Headache, unspecified: Secondary | ICD-10-CM | POA: Insufficient documentation

## 2022-10-22 LAB — CBC WITH DIFFERENTIAL/PLATELET
Abs Immature Granulocytes: 0.02 10*3/uL (ref 0.00–0.07)
Basophils Absolute: 0 10*3/uL (ref 0.0–0.1)
Basophils Relative: 1 %
Eosinophils Absolute: 0.1 10*3/uL (ref 0.0–0.5)
Eosinophils Relative: 2 %
HCT: 41 % (ref 36.0–46.0)
Hemoglobin: 14 g/dL (ref 12.0–15.0)
Immature Granulocytes: 0 %
Lymphocytes Relative: 37 %
Lymphs Abs: 2 10*3/uL (ref 0.7–4.0)
MCH: 32.7 pg (ref 26.0–34.0)
MCHC: 34.1 g/dL (ref 30.0–36.0)
MCV: 95.8 fL (ref 80.0–100.0)
Monocytes Absolute: 0.6 10*3/uL (ref 0.1–1.0)
Monocytes Relative: 11 %
Neutro Abs: 2.7 10*3/uL (ref 1.7–7.7)
Neutrophils Relative %: 49 %
Platelets: 237 10*3/uL (ref 150–400)
RBC: 4.28 MIL/uL (ref 3.87–5.11)
RDW: 13.7 % (ref 11.5–15.5)
WBC: 5.5 10*3/uL (ref 4.0–10.5)
nRBC: 0 % (ref 0.0–0.2)

## 2022-10-22 LAB — COMPREHENSIVE METABOLIC PANEL
ALT: 17 U/L (ref 0–44)
AST: 24 U/L (ref 15–41)
Albumin: 4.5 g/dL (ref 3.5–5.0)
Alkaline Phosphatase: 72 U/L (ref 38–126)
Anion gap: 8 (ref 5–15)
BUN: 12 mg/dL (ref 6–20)
CO2: 27 mmol/L (ref 22–32)
Calcium: 9 mg/dL (ref 8.9–10.3)
Chloride: 100 mmol/L (ref 98–111)
Creatinine, Ser: 0.81 mg/dL (ref 0.44–1.00)
GFR, Estimated: >60 mL/min (ref >60)
Glucose, Bld: 99 mg/dL (ref 70–99)
Potassium: 3.6 mmol/L (ref 3.5–5.1)
Sodium: 135 mmol/L (ref 135–145)
Total Bilirubin: 0.5 mg/dL (ref 0.3–1.2)
Total Protein: 7.4 g/dL (ref 6.5–8.1)

## 2022-10-22 MED ORDER — MECLIZINE HCL 25 MG PO TABS: 25.0000 mg | ORAL_TABLET | Freq: Three times a day (TID) | ORAL | 0 refills | Status: AC | PRN

## 2022-10-22 NOTE — Discharge Instructions (Signed)
-  On exam there are no abnormalities neurologically  Lab work checking your electrolytes and blood levels are pending, you will be notified of any concerning values until that report, if you do not hear from [place] then everything is normal   You may use meclizine 3 times daily as needed (every 8 hours), be mindful this medication may make you drowsy, take first dosage at home, if helpful but not resolving symptoms you may attempt use of 50 mg (2 tablets)  Continue to change positions slowly, waiting at least 10 seconds between movements to help the body stabilize   Ensure adequate intake of water as dehydration will make your symptoms worse  May Attempt to complete the following exercises below to see if they will help minimize your dizziness  -Shake: Turn your head from right to left and back again 10 times in 10 seconds. Twist your head round as far as it will go comfortably when you do this, and look in the direction your head is pointing. Wait 10 seconds after you have done 10 complete turns, then do 10 more.  -Nod: Nod your head up and down and back again 10 times in 10 seconds. Tip your head as far as it will go comfortably when you do this, and look in the direction your head is pointing. Wait 10 seconds after you have done 10 complete turns, then do 10 more.  -Shake, eyes closed (EC): Carry out the shake exercise with your eyes closed. Wait 10 seconds after you have done 10 complete turns, then do 10 more.  -Nod, eyes closed (EC): Carry out the nod exercise with your eyes closed. Wait 10 seconds after you have done 10 complete turns, then do 10 more.  -Shake/stare: Hold your finger pointing upwards in front of you and carry out the shake exercise while staring at your finger. Do not let your eyes move from your finger. Wait 10 seconds after you have done 10 complete turns, then do 10 more.  -Nod/stare: Hold your finger pointing sideways in front of you and carry out the nod exercise while  staring at your finger. Do not let your eyes move from your finger. Wait 10 seconds after you have done 10 complete turns, then do 10 more.  For your headache  -While headaches are present ensure that you are getting adequate rest and adequate fluid intake  -Participate in low stimulation activities avoiding bright lights and loud noises when symptoms are present  -At any point if you have the worst headache of your life please go to the nearest emergency department for immediate evaluation

## 2022-10-22 NOTE — ED Provider Notes (Addendum)
[facility]    CSN: [id number] Arrival date & time: 10/22/22  1014      History   Chief Complaint Chief Complaint  Patient presents with   Dizziness   Headache    HPI BRYAH OCHELTREE is a 57 y.o. female.   Patient presents for evaluation of dizziness and headache beginning 4 days ago.  Symptoms occurring intermittently with dizziness beginning first.  Sensation is independent of movement, described as feeling imbalance with associated lightheadedness but denies syncope.  Headache is present on the top of the head, described as throbbing and pulsating with associated photophobia.  Has attempted use of Excedrin which has been helpful.  Denies visual disturbance, memory changes or speech changes, general weakness, chest pain or shortness of breath, nausea or vomiting.  Past Medical History:  Diagnosis Date   Cancer [facility])    Ovaries removed   Tubal pregnancy     There are no problems to display for this patient.   Past Surgical History:  Procedure Laterality Date   OOPHORECTOMY      OB History   No obstetric history on file.      Home Medications    Prior to Admission medications   Medication Sig Start Date End Date Taking? Authorizing Provider  baclofen (LIORESAL) 10 MG tablet Take 1 tablet (10 mg total) by mouth 3 (three) times daily as needed for muscle spasms. 06/18/22   Danton Clap, PA-C  naproxen (NAPROSYN) 375 MG tablet Take 1 tablet (375 mg total) by mouth 2 (two) times daily as needed for moderate pain. 06/18/22   Danton Clap, PA-C    Family History No family history on file.  Social History Social History   Tobacco Use   Smoking status: Every Day    Packs/day: 0.50    Types: Cigarettes   Smokeless tobacco: Never  Vaping Use   Vaping Use: Never used  Substance Use Topics   Alcohol use: No   Drug use: Not Currently    Types: Marijuana     Allergies   Patient has no known allergies.   Review of Systems Review of Systems   Constitutional: Negative.   HENT: Negative.    Respiratory: Negative.    Cardiovascular: Negative.   Gastrointestinal: Negative.   Skin: Negative.   Neurological:  Positive for dizziness and headaches. Negative for tremors, seizures, syncope, facial asymmetry, speech difficulty, weakness, light-headedness and numbness.     Physical Exam Triage Vital Signs ED Triage Vitals  Enc Vitals Group     BP 10/22/22 1033 124/74     Pulse Rate 10/22/22 1033 72     Resp 10/22/22 1033 16     Temp 10/22/22 1033 98 F (36.7 C)     Temp Source 10/22/22 1033 Oral     SpO2 10/22/22 1033 98 %     Weight --      Height --      Head Circumference --      Peak Flow --      Pain Score 10/22/22 1032 6     Pain Loc --      Pain Edu? --      Excl. in [place]? --    No data found.  Updated Vital Signs BP 124/74 (BP Location: Left Arm)   Pulse 72   Temp 98 F (36.7 C) (Oral)   Resp 16   SpO2 98%   Visual Acuity Right Eye Distance:   Left Eye Distance:   Bilateral  Distance:    Right Eye Near:   Left Eye Near:    Bilateral Near:     Physical Exam Constitutional:      Appearance: Normal appearance.  Eyes:     Extraocular Movements: Extraocular movements intact.     Conjunctiva/sclera: Conjunctivae normal.     Pupils: Pupils are equal, round, and reactive to light.  Pulmonary:     Effort: Pulmonary effort is normal.  Neurological:     General: No focal deficit present.     Mental Status: She is alert and oriented to person, place, and time. Mental status is at baseline.     Cranial Nerves: No cranial nerve deficit.     Motor: No weakness.     Gait: Gait normal.      UC Treatments / Results  Labs (all labs ordered are listed, but only abnormal results are displayed) Labs Reviewed - No data to display  EKG   Radiology No results found.  Procedures Procedures (including critical care time)  Medications Ordered in UC Medications - No data to display  Initial Impression /  Assessment and Plan / UC Course  I have reviewed the triage vital signs and the nursing notes.  Pertinent labs & imaging results that were available during my care of the patient were reviewed by me and considered in my medical decision making (see chart for details).  Bad headache, dizziness  Vital signs are stable patient is in no signs of distress nontoxic-appearing, no abnormalities to the bilateral ears contributing, no neurological abnormalities, no history of cardiac or respiratory illness, low suspicion of cause, CBC and CMP pending , will move forward with management of symptoms the patient is stable for outpatient treatment, prescribed meclizine, may continue use of Excedrin outpatient, discussed supportive measures with increase fluid intake and rest, given strict precautions for worsening symptoms to follow-up with the nearest emergency department for further evaluation and management Final Clinical Impressions(s) / UC Diagnoses   Final diagnoses:  None   Discharge Instructions   None    ED Prescriptions   None    PDMP not reviewed this encounter.   Hans Eden, NP 10/22/22 1203    Hans Eden, [place] 10/22/22 [phone]

## 2022-10-22 NOTE — ED Triage Notes (Signed)
Pt presents with dizziness and headache x 4 days.

## 2023-04-20 ENCOUNTER — Emergency Department
Admission: EM | Admit: 2023-04-20 | Discharge: 2023-04-21 | Disposition: A | Discharge status: Home / Self Care | Payer: Self-pay | Attending: Emergency Medicine | Admitting: Emergency Medicine

## 2023-04-20 ENCOUNTER — Other Ambulatory Visit: Payer: Self-pay

## 2023-04-20 ENCOUNTER — Encounter: Payer: Self-pay | Admitting: Emergency Medicine

## 2023-04-20 DIAGNOSIS — R09A2 Foreign body sensation, throat: Secondary | ICD-10-CM | POA: Insufficient documentation

## 2023-04-20 NOTE — ED Triage Notes (Signed)
Patient ambulatory to triage with steady gait, without difficulty or distress noted; pt reports eating a cracker PTA and now has the sensation that it is stuck in her throat; pt able to swallow liquids without difficulty

## 2023-04-20 NOTE — ED Provider Notes (Signed)
[facility] Provider Note    Event Date/Time   First MD Initiated Contact with Patient 04/20/23 2332     (approximate)   History   Foreign Body   HPI  Robin Cline is a 57 y.o. female   Past medical history of no pertinent past medical history presents to the emergency department for sensation that a cracker stuck in her lower throat/upper chest.  Happened this evening.  Otherwise has been healthy no other acute medical complaints.  No respiratory complaints, maintaining secretions, comfortable, and has been able to drink and eat afterwards.  Independent Historian contributed to assessment above: Her son is at bedside to corroborate information given above    Physical Exam   Triage Vital Signs: ED Triage Vitals  Encounter Vitals Group     BP 04/20/23 2331 127/70     Systolic BP Percentile --      Diastolic BP Percentile --      Pulse Rate 04/20/23 2331 88     Resp 04/20/23 2331 18     Temp 04/20/23 2331 97.9 F (36.6 C)     Temp Source 04/20/23 2331 Oral     SpO2 04/20/23 2331 100 %     Weight 04/20/23 2330 103 lb (46.7 kg)     Height 04/20/23 2330 5\' 3"  (1.6 m)     Head Circumference --      Peak Flow --      Pain Score 04/20/23 2330 0     Pain Loc --      Pain Education --      Exclude from Growth Chart --     Most recent vital signs: Vitals:   04/20/23 2331  BP: 127/70  Pulse: 88  Resp: 18  Temp: 97.9 F (36.6 C)  SpO2: 100%    General: Awake, no distress.  CV:  Good peripheral perfusion.  Resp:  Normal effort.  Abd:  No distention.  Other:  Comfortable appearing patient in no acute distress, phonation is normal, respirations normal lungs clear.  Normal posterior oropharynx.  Neck supple with full range of motion.  Able to tolerate water   ED Results / Procedures / Treatments   Labs (all labs ordered are listed, but only abnormal results are displayed) Labs Reviewed - No data to display  \ PROCEDURES:  Critical  Care performed: No  Procedures   MEDICATIONS ORDERED IN ED: Medications - No data to display  IMPRESSION / MDM / ASSESSMENT AND PLAN / ED COURSE  I reviewed the triage vital signs and the nursing notes.                                Patient's presentation is most consistent with acute presentation with potential threat to life or bodily function.  Differential diagnosis includes, but is not limited to, food impaction, globus sensation, foreign body in throat   The patient is on the cardiac monitor to evaluate for evidence of arrhythmia and/or significant heart rate changes.  MDM:   Well-appearing patient tolerating p.o. intake with a sensation of cracker stuck in her lower throat/upper chest.  Very well-appearing.  Able to tolerate p.o., so very much doubt complete blockage/food impaction that would require emergent intervention at this time.  She will continue to monitor symptoms over the next couple days and if they continue has GI referral.  Knows to come back if any worsening.  FINAL CLINICAL IMPRESSION(S) / ED DIAGNOSES   Final diagnoses:  Foreign body sensation, throat     Rx / DC Orders   ED Discharge Orders          Ordered    Ambulatory referral to Gastroenterology        04/21/23 0012             Note:  This document was prepared using Dragon voice recognition software and may include unintentional dictation errors.    Pilar Jarvis, MD 04/21/23 [phone]

## 2023-04-21 NOTE — Discharge Instructions (Signed)
Drink carbonated beverages as this may help to dislodge any food remnants stuck in your throat..  Focus on soft foods while you still have symptoms.  If in the next few days you continue to have symptoms, call gastroenterology for an appointment.  If you experience any new, worsening, or expected symptoms call your doctor right away return to the emergency department for reevaluation.

## 2023-12-30 ENCOUNTER — Ambulatory Visit
Admission: EM | Admit: 2023-12-30 | Discharge: 2023-12-30 | Disposition: A | Discharge status: Home / Self Care | Payer: Self-pay | Attending: Family Medicine | Admitting: Family Medicine

## 2023-12-30 ENCOUNTER — Encounter: Payer: Self-pay | Admitting: Emergency Medicine

## 2023-12-30 ENCOUNTER — Ambulatory Visit (INDEPENDENT_AMBULATORY_CARE_PROVIDER_SITE_OTHER): Payer: Self-pay

## 2023-12-30 DIAGNOSIS — R079 Chest pain, unspecified: Secondary | ICD-10-CM

## 2023-12-30 DIAGNOSIS — R091 Pleurisy: Secondary | ICD-10-CM

## 2023-12-30 NOTE — ED Provider Notes (Signed)
 [facility]  Note:  This document was prepared using Conservation officer, historic buildings and may include unintentional dictation errors.  MRN: [id number] DOB: 1966-01-23  Subjective:   Robin Cline is a 58 y.o. female presenting for right anterior lateral chest pain under the right ribs x 6 days.  Patient denies any known injury or trauma.  She states that pain is worse with deep breathing and with palpation of the area.  Has not taken any over-the-counter pain reliever to help with symptoms.  Patient thought that pain would improve over the last week but symptoms are still the same if not a little bit worse.  Patient denies any other medical concerns at this time.  No current facility-administered medications for this encounter.  Current Outpatient Medications:    baclofen (LIORESAL) 10 MG tablet, Take 1 tablet (10 mg total) by mouth 3 (three) times daily as needed for muscle spasms., Disp: 20 each, Rfl: 0   meclizine (ANTIVERT) 25 MG tablet, Take 1 tablet (25 mg total) by mouth 3 (three) times daily as needed for dizziness., Disp: 30 tablet, Rfl: 0   naproxen (NAPROSYN) 375 MG tablet, Take 1 tablet (375 mg total) by mouth 2 (two) times daily as needed for moderate pain., Disp: 20 tablet, Rfl: 0   No Known Allergies  Past Medical History:  Diagnosis Date   Cancer ([facility])    Ovaries removed   Tubal pregnancy      Past Surgical History:  Procedure Laterality Date   OOPHORECTOMY      History reviewed. No pertinent family history.  Social History   Tobacco Use   Smoking status: Every Day    Current packs/day: 0.50    Types: Cigarettes    Passive exposure: Current   Smokeless tobacco: Never  Vaping Use   Vaping status: Never Used  Substance Use Topics   Alcohol use: No   Drug use: Not Currently    Types: Marijuana    ROS Refer to HPI for ROS details.  Objective:   Vitals: BP 121/81 (BP Location: Left Arm)   Pulse 71   Temp 98.8 F (37.1 C) (Oral)   Resp  18   SpO2 97%   Physical Exam Vitals and nursing note reviewed.  Constitutional:      General: She is not in acute distress.    Appearance: She is well-developed. She is not ill-appearing or toxic-appearing.  HENT:     Head: Normocephalic and atraumatic.     Mouth/Throat:     Mouth: Mucous membranes are moist.  Cardiovascular:     Rate and Rhythm: Normal rate and regular rhythm.     Heart sounds: No murmur heard. Pulmonary:     Effort: Pulmonary effort is normal. No respiratory distress.     Breath sounds: No decreased breath sounds, wheezing, rhonchi or rales.  Musculoskeletal:        General: Normal range of motion.  Skin:    General: Skin is warm and dry.  Neurological:     General: No focal deficit present.     Mental Status: She is alert and oriented to person, place, and time.  Psychiatric:        Mood and Affect: Mood normal.        Behavior: Behavior normal.     Procedures  No results found for this or any previous visit (from the past 24 hours).  Assessment and Plan :     Discharge Instructions      1.  Pleuritis (Primary) - ED EKG performed in UC shows normal sinus rhythm with short PR interval, ventricular rate of 69 bpm, no STEMI. - DG Chest 2 View x-ray performed in UC shows no acute cardiopulmonary processes, no sign of consolidation or pneumonia, hyperinflated airspace consistent with COPD. - Take ibuprofen 600 mg every 6-8 hours as needed for pain and inflammation secondary to pleuritis -Continue to monitor symptoms for any change in severity if there is any escalation of current symptoms or development of new symptoms follow-up in ER for further evaluation and management.         Daune Divirgilio B Adrin Julian   [place] Fleek, [place], [place] 12/30/23 1404

## 2023-12-30 NOTE — ED Triage Notes (Signed)
 Pt c/o chest pain on right side of chest underneath right breast. She states "it's hard to take a deep breath and hurts to cough" x 6 days.

## 2023-12-30 NOTE — Discharge Instructions (Addendum)
 1. Pleuritis (Primary) - ED EKG performed in UC shows normal sinus rhythm with short PR interval, ventricular rate of 69 bpm, no STEMI. - DG Chest 2 View x-ray performed in UC shows no acute cardiopulmonary processes, no sign of consolidation or pneumonia, hyperinflated airspace consistent with COPD. - Take ibuprofen 600 mg every 6-8 hours as needed for pain and inflammation secondary to pleuritis -Continue to monitor symptoms for any change in severity if there is any escalation of current symptoms or development of new symptoms follow-up in ER for further evaluation and management.

## 2024-03-09 ENCOUNTER — Emergency Department: Payer: Self-pay

## 2024-03-09 ENCOUNTER — Other Ambulatory Visit: Payer: Self-pay

## 2024-03-09 ENCOUNTER — Emergency Department
Admission: EM | Admit: 2024-03-09 | Discharge: 2024-03-09 | Disposition: A | Discharge status: Home / Self Care | Payer: Self-pay | Attending: Emergency Medicine | Admitting: Emergency Medicine

## 2024-03-09 DIAGNOSIS — S2241XD Multiple fractures of ribs, right side, subsequent encounter for fracture with routine healing: Secondary | ICD-10-CM | POA: Diagnosis not present

## 2024-03-09 DIAGNOSIS — X58XXXD Exposure to other specified factors, subsequent encounter: Secondary | ICD-10-CM | POA: Diagnosis not present

## 2024-03-09 DIAGNOSIS — S299XXD Unspecified injury of thorax, subsequent encounter: Secondary | ICD-10-CM | POA: Diagnosis present

## 2024-03-09 MED ORDER — MELOXICAM 7.5 MG PO TABS: 7.5000 mg | ORAL_TABLET | Freq: Every day | ORAL | 2 refills | Status: AC

## 2024-03-09 MED ORDER — BACLOFEN 10 MG PO TABS: 10.0000 mg | ORAL_TABLET | Freq: Every day | ORAL | 1 refills | Status: AC

## 2024-03-09 NOTE — Discharge Instructions (Addendum)
 Your evaluated in the ED for rib pain.  Your rib x-ray reveals a appropriate healing fracture of ribs 6-8.  Continue to rest and limit your physical activity.  Rib fractures take about 6 to 8 weeks to completely heal.  If any symptoms of shortness of breath or increased pain please return to the ED for further evaluation.

## 2024-03-09 NOTE — ED Provider Notes (Signed)
 [facility] Emergency Department Provider Note     Event Date/Time   First MD Initiated Contact with Patient 03/09/24 1502     (approximate)   History   Rib Injury   HPI  Robin Cline is a 58 y.o. female presents to the ED for evaluation of right rib cage pain.  Patient was recently diagnosed with a closed right rib fracture 1 month ago.  She was given meloxicam that relieved her symptoms but has recently ran out of this medication.  She reports her pain has progressively worsened since returning back to work.  She denies shortness of breath or chest pain. No new injuries.     Physical Exam   Triage Vital Signs: ED Triage Vitals  Encounter Vitals Group     BP 03/09/24 1346 (!) 148/96     Girls Systolic BP Percentile --      Girls Diastolic BP Percentile --      Boys Systolic BP Percentile --      Boys Diastolic BP Percentile --      Pulse Rate 03/09/24 1346 87     Resp 03/09/24 1346 16     Temp 03/09/24 1346 98 F (36.7 C)     Temp Source 03/09/24 1346 Oral     SpO2 03/09/24 1346 98 %     Weight 03/09/24 1350 100 lb (45.4 kg)     Height 03/09/24 1350 5' 3 (1.6 m)     Head Circumference --      Peak Flow --      Pain Score 03/09/24 1350 8     Pain Loc --      Pain Education --      Exclude from Growth Chart --     Most recent vital signs: Vitals:   03/09/24 1346  BP: (!) 148/96  Pulse: 87  Resp: 16  Temp: 98 F (36.7 C)  SpO2: 98%    General Awake, no distress.  HEENT NCAT.  CV:  Good peripheral perfusion.  RESP:  Normal effort. LCTAB.  Symmetric rise and fall of chest wall. ABD:  No distention.  Other:  Tenderness to palpation over anterior lateral right rib cage.   ED Results / Procedures / Treatments   Labs (all labs ordered are listed, but only abnormal results are displayed) Labs Reviewed - No data to display  RADIOLOGY  I personally viewed and evaluated these images as part of my medical decision making, as  well as reviewing the written report by the radiologist.  ED Provider Interpretation: Right anterior lateral sixth eighth rib fracture.  DG Ribs Unilateral W/Chest Right Result Date: 03/09/2024 CLINICAL DATA:  rib fx EXAM: RIGHT RIBS AND CHEST - 3+ VIEW COMPARISON:  December 30, 2023 FINDINGS: No focal airspace consolidation, pleural effusion, or pneumothorax. No cardiomegaly. Healing right anterolateral 6th-8th rib fractures. IMPRESSION: Healing right anterolateral 6th-8th rib fractures. No pneumonia or pneumothorax. Electronically Signed   By: Rogelia Myers M.D.   On: 03/09/2024 14:45    PROCEDURES:  Critical Care performed: No  Procedures   MEDICATIONS ORDERED IN ED: Medications - No data to display   IMPRESSION / MDM / ASSESSMENT AND PLAN / ED COURSE  I reviewed the triage vital signs and the nursing notes.                               58 y.o. female presents to the emergency  department for evaluation and treatment of right rib cage pain. See HPI for further details.   Differential diagnosis includes, but is not limited to fracture, strain, contusion  Patient's presentation is most consistent with acute complicated illness / injury requiring diagnostic workup.  Patient is alert and oriented.  She is hemodynamically stable.  On physical exam she is well-appearing and lung exam is reassuring.  Expected tenderness to palpation over anterior lateral right rib cage.  X-rays confirm healing sixth-eighth rib fracture.  Reassurance provided.  Work note with limitations provided.  Will refill medication on meloxicam and baclofen .  Patient is in stable condition for discharge home.  ED return precautions discussed.   FINAL CLINICAL IMPRESSION(S) / ED DIAGNOSES   Final diagnoses:  Closed fracture of multiple ribs of right side with routine healing, subsequent encounter   Rx / DC Orders   ED Discharge Orders          Ordered    baclofen  (LIORESAL ) 10 MG tablet  Daily         03/09/24 1519    meloxicam (MOBIC) 7.5 MG tablet  Daily        03/09/24 1519           Note:  This document was prepared using Dragon voice recognition software and may include unintentional dictation errors.    Margrette, Blaise Grieshaber A, PA-C 03/09/24 1611    Dorothyann Drivers, MD 03/09/24 [phone]

## 2024-03-09 NOTE — ED Triage Notes (Signed)
 Pt to ED via POV from home. Pt reports started to have right rib pain and was seen at [facility] and dx with fracture ribs. Pt reports was given meloxicam. Pt reports had a long weekend and went back to work on Monday and pain has gotten worse and is out of prescription meds. Pt states is a [employer], [employer]. No SOB or CP. Pt is everyday smoker.
